# Patient Record
Sex: Male | Born: 2000 | Race: White | Hispanic: No | Marital: Single | Smoking: Never smoker
Health system: Southern US, Community
[De-identification: ages and names within clinical notes are randomized; demographics above are authoritative.]

## PROBLEM LIST (undated history)

## (undated) DIAGNOSIS — F840 Autistic disorder: Secondary | ICD-10-CM

## (undated) DIAGNOSIS — E119 Type 2 diabetes mellitus without complications: Secondary | ICD-10-CM

## (undated) HISTORY — PX: OTHER SURGICAL HISTORY: SHX169

## (undated) HISTORY — PX: HERNIA REPAIR: SHX51

---
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
## 2022-08-04 IMAGING — CT CT CHEST-ABD-PELV W/ CM
2 of 4 series · 13 of 36 positions shown, 15 images · IV contrast (omnipaque)
Comparison: None.

CLINICAL DATA: Foreign body suspected. Autistic patient started
eating dinner and began to vomit, per dad he has vomited too many
times to count. Dad has noticed blood in vomit.

EXAM:
CT CHEST, ABDOMEN, AND PELVIS WITH CONTRAST
TECHNIQUE: Multidetector CT imaging of the chest, abdomen and pelvis was
performed following the standard protocol during bolus
administration of intravenous contrast.
CONTRAST:  100mL OMNIPAQUE IOHEXOL 300 MG/ML  SOLN

[Series 2: cap with · axial · 0.81mm/px · z∈[+69,+634]mm · 10 of 135 slices shown, 12 images]
[im 11/135  mediastinal]
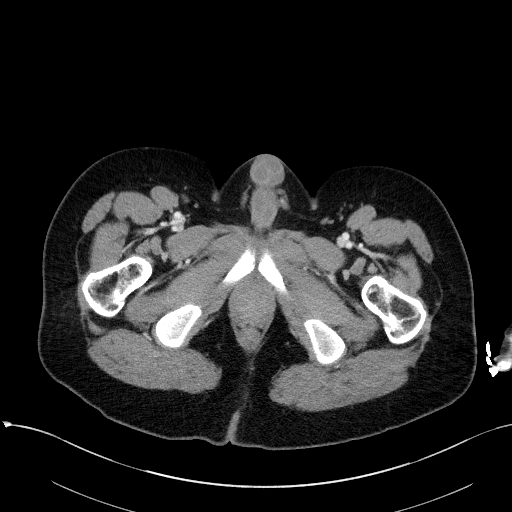
[im 11/135  bone]
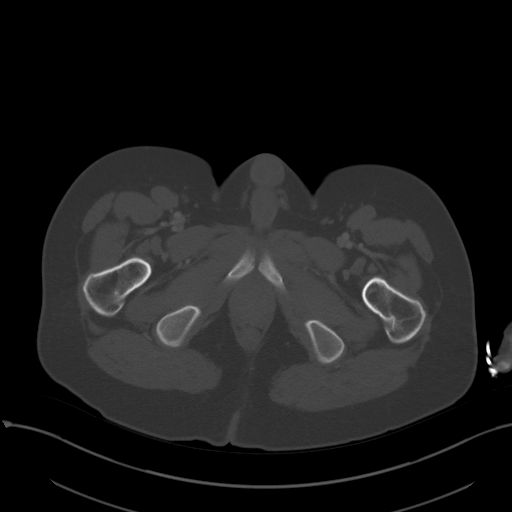
[im 21/135  mediastinal]
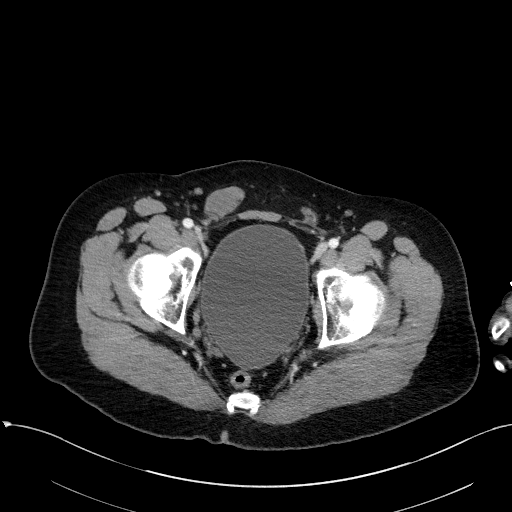
[im 42/135  mediastinal]
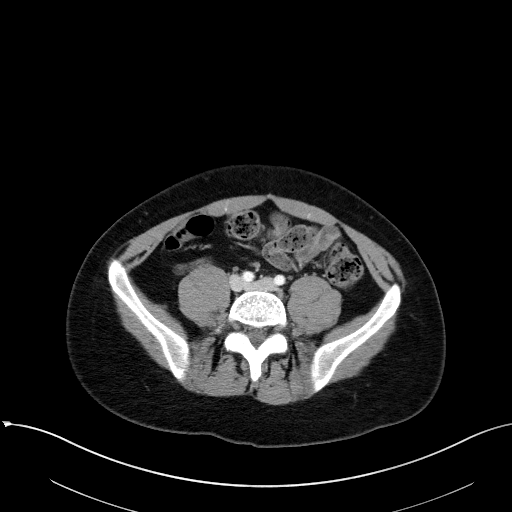
[im 52/135  mediastinal]
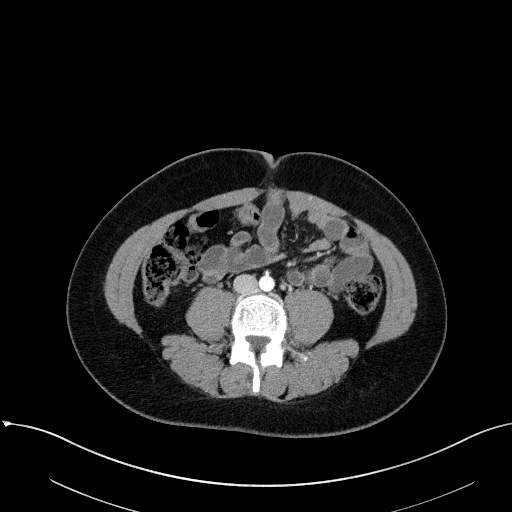
[im 62/135  mediastinal]
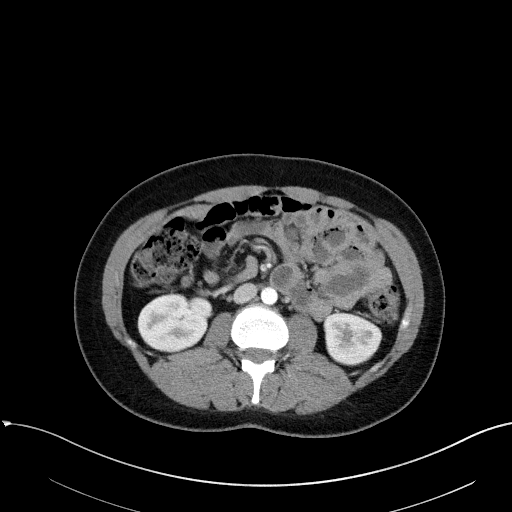
[im 73/135  mediastinal]
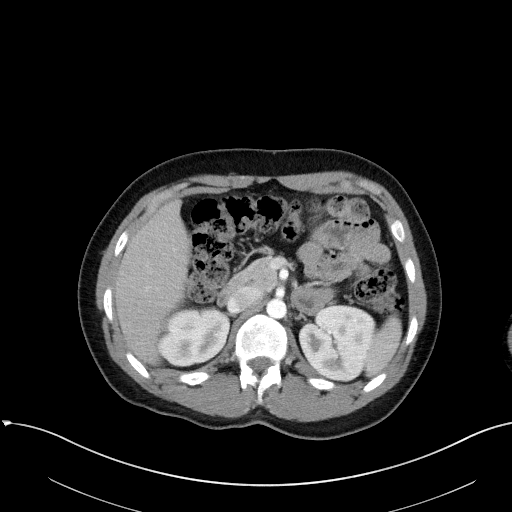
[im 83/135  mediastinal]
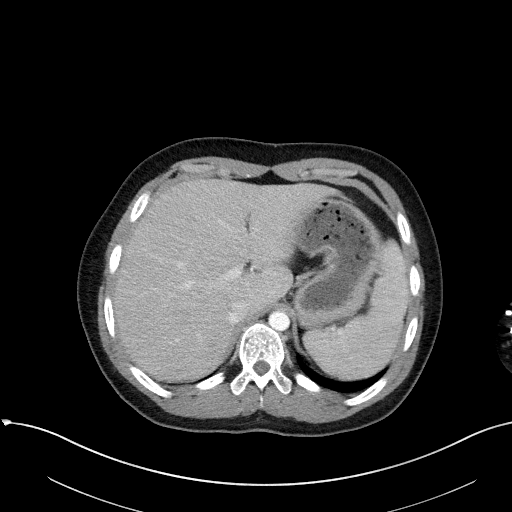
[im 104/135  mediastinal]
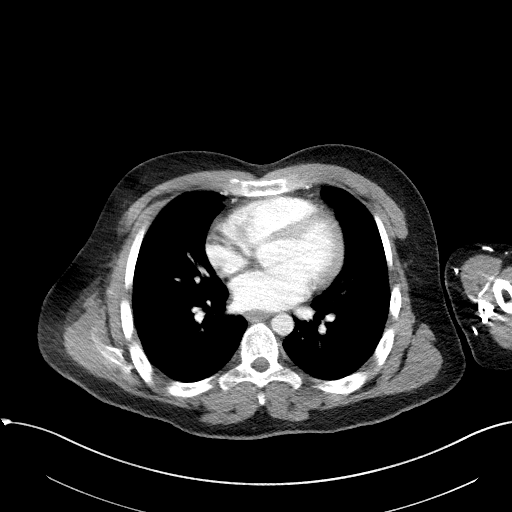
[im 114/135  mediastinal]
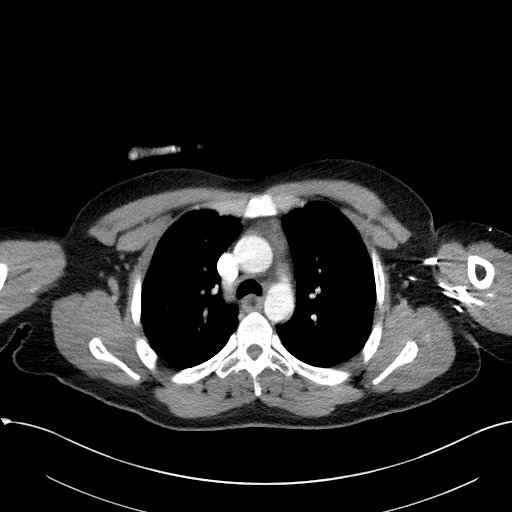
[im 114/135  bone]
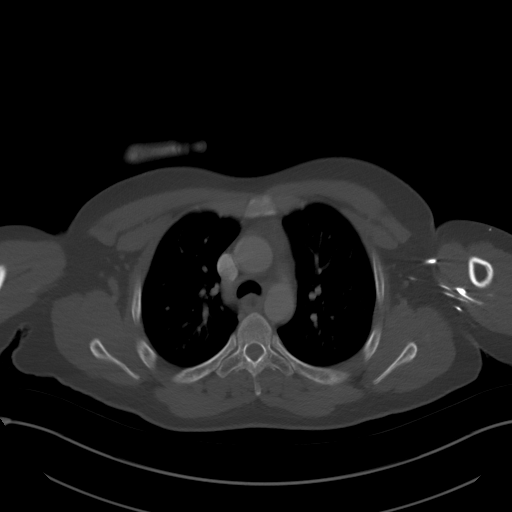
[im 124/135  mediastinal]
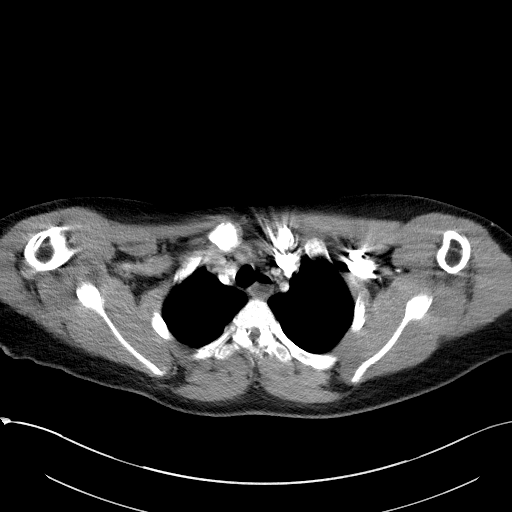

[Series 5: coronals · coronal · 0.70mm/px · 3 of 116 slices shown]
[im 24/116  mediastinal]
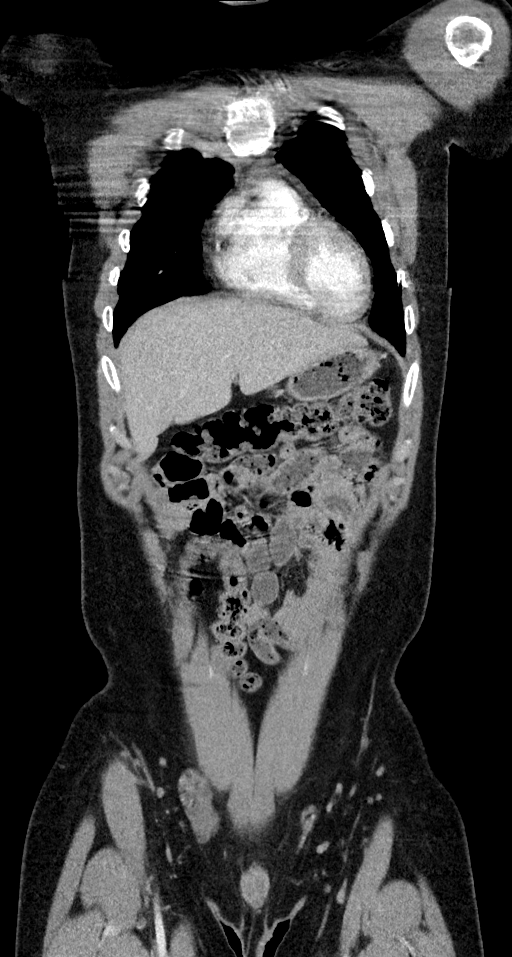
[im 47/116  mediastinal]
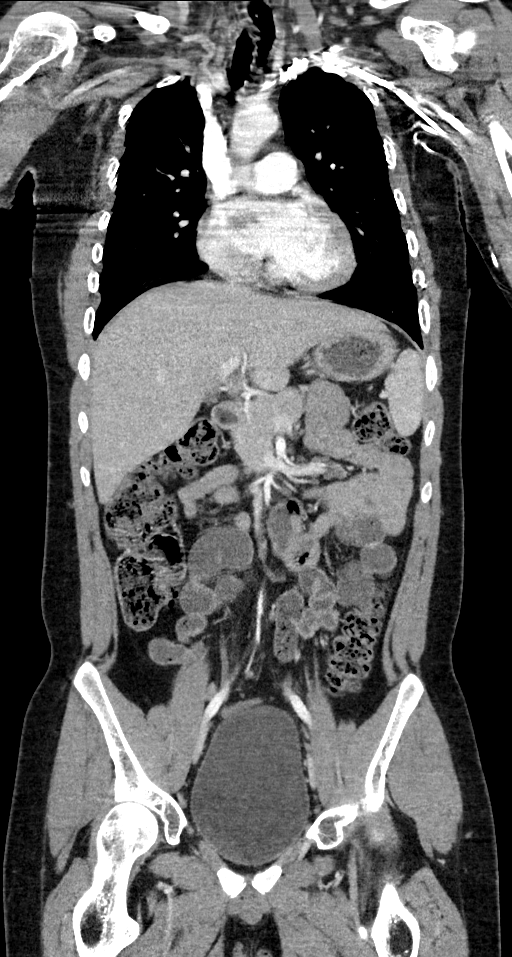
[im 70/116  mediastinal]
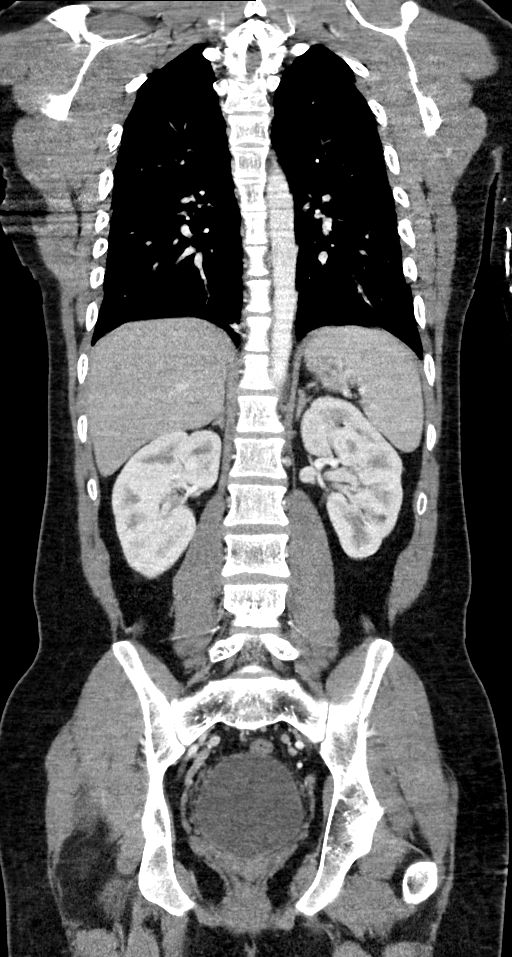

[13 of 36 positions shown; findings below may reference images not displayed]

FINDINGS: CT CHEST FINDINGS

No retained radiopaque foreign body identified.

Cardiovascular: Normal heart size. No significant pericardial
effusion. The thoracic aorta is normal in caliber. No
atherosclerotic plaque of the thoracic aorta. No coronary artery
calcifications. The main pulmonary artery is normal in caliber. No
central pulmonary embolus.

Mediastinum/Nodes: No pneumomediastinum. No inflammatory changes
within the mediastinum. No enlarged mediastinal, hilar, or axillary
lymph nodes. Thyroid gland and trachea demonstrate no significant
findings. Distal esophageal wall thickening circumferentially. Fluid
noted within the proximal esophageal lumen.

Lungs/Pleura: No pulmonary nodule. No pulmonary mass. No focal
consolidation. No pleural effusion. No pneumothorax.

Musculoskeletal: No chest wall mass or suspicious bone lesions
identified.

CT ABDOMEN PELVIS FINDINGS

No retained radiopaque foreign body identified.

Hepatobiliary: No focal liver abnormality. No gallstones,
gallbladder wall thickening, or pericholecystic fluid. No biliary
dilatation.

Pancreas: No focal lesion. Normal pancreatic contour. No surrounding
inflammatory changes. No main pancreatic ductal dilatation.

Spleen: Normal in size without focal abnormality.

Adrenals/Urinary Tract:

No adrenal nodule bilaterally.

Bilateral kidneys enhance symmetrically. Subcentimeter hypodensity
within left kidney is too small to characterize. No hydronephrosis.
No hydroureter.

The urinary bladder is unremarkable.

Stomach/Bowel: Stomach is within normal limits. No evidence of bowel
wall thickening or dilatation. Appendix appears normal.

Vascular/Lymphatic: No significant vascular findings are present. No
enlarged abdominal or pelvic lymph nodes.

Reproductive: Prostate is unremarkable. The right testis is noted
within the right inguinal canal. The left testis is noted within the
scrotal sac.

Other: No intraperitoneal free fluid. No intraperitoneal free gas.
No organized fluid collection.

Musculoskeletal: No acute or significant osseous findings.
IMPRESSION: 1. No retained radiopaque foreign body identified.
2. Circumferential distal esophageal wall thickening suggestive of
esophagitis.
3. Right testis within the right inguinal canal. Finding could
represent an undescended tested or retractile testis. Please
correlate with physical exam.
4. Normal appendix.
